# Patient Record
Sex: Female | Born: 1960 | Race: White | Hispanic: No | Marital: Married | Smoking: Never smoker
Health system: Southern US, Community
[De-identification: ages and names within clinical notes are randomized; demographics above are authoritative.]

---

## 2018-05-05 ENCOUNTER — Emergency Department (INDEPENDENT_AMBULATORY_CARE_PROVIDER_SITE_OTHER): Payer: Medicare Other

## 2018-05-05 ENCOUNTER — Encounter: Payer: Self-pay | Admitting: Emergency Medicine

## 2018-05-05 ENCOUNTER — Emergency Department (INDEPENDENT_AMBULATORY_CARE_PROVIDER_SITE_OTHER)
Admission: EM | Admit: 2018-05-05 | Discharge: 2018-05-05 | Disposition: A | Discharge status: Home / Self Care | Payer: Medicare Other | Source: Home / Self Care | Attending: Family Medicine | Admitting: Family Medicine

## 2018-05-05 DIAGNOSIS — R05 Cough: Secondary | ICD-10-CM

## 2018-05-05 DIAGNOSIS — J069 Acute upper respiratory infection, unspecified: Secondary | ICD-10-CM

## 2018-05-05 DIAGNOSIS — R0602 Shortness of breath: Secondary | ICD-10-CM | POA: Diagnosis not present

## 2018-05-05 MED ORDER — ALBUTEROL SULFATE HFA 108 (90 BASE) MCG/ACT IN AERS: 1.0000 | INHALATION_SPRAY | Freq: Four times a day (QID) | RESPIRATORY_TRACT | 0 refills | Status: AC | PRN

## 2018-05-05 NOTE — ED Triage Notes (Signed)
Patient c/o SOB x 2 days, worse w/movement, non-productive cough, runny nose, congestion

## 2018-05-05 NOTE — Discharge Instructions (Signed)
°  You may try the inhaler if your shortness of breath comes back.  If your symptoms are worsening- chest pain, weakness/numbness/dizziness, or your shortness of breath is not improving with the inhaler or rest, please call 911 or have someone take you to the hospital.  Please follow up with your family doctor later this week as needed.

## 2018-05-05 NOTE — ED Provider Notes (Signed)
[facility]    CSN: [id number] Arrival date & time: 05/05/18  1116     History   Chief Complaint Chief Complaint  Patient presents with  . Shortness of Breath    HPI Carrie Lopez is a 57 y.o. female.   HPI Carrie Lopez is a 57 y.o. female presenting to UC with c/o SOB for about 2 days, worse with movement. Associated non-productive cough, rhinorrhea and chest congestion.  She completed a course of antibiotics, she believes Azithromycin, about 1-2 weeks ago for URI symptoms.  Her symptoms seemed to improve until the last 2 days.  SOB is worse in the morning. She is feeling better in UC than she did when she initially woke this morning.  Denies CP. No SOB on exertion. She has been using essential oil to rub on her chest to help with cough, minimal improvement. She has used an inhaler in the past but does not currently have one.    History reviewed. No pertinent past medical history.  There are no active problems to display for this patient.   History reviewed. No pertinent surgical history.  OB History   None      Home Medications    Prior to Admission medications   Medication Sig Start Date End Date Taking? Authorizing Provider  DULoxetine (CYMBALTA) 60 MG capsule Take by mouth. 03/29/18  Yes [provider]  hydrochlorothiazide (HYDRODIURIL) 25 MG tablet Take by mouth. 03/28/18  Yes [provider]  lamoTRIgine (LAMICTAL) 200 MG tablet Take by mouth. 11/28/16  Yes [provider]  levothyroxine (SYNTHROID, LEVOTHROID) 100 MCG tablet TAKE 1 TABLET BY MOUTH ONCE DAILY AT 0600 05/03/18  Yes [provider]  pantoprazole (PROTONIX) 40 MG tablet Take by mouth. 03/08/17  Yes [provider]  traZODone (DESYREL) 150 MG tablet Take by mouth.   Yes [provider]  albuterol (PROVENTIL HFA;VENTOLIN HFA) 108 (90 Base) MCG/ACT inhaler Inhale 1-2 puffs into the lungs every 6 (six) hours as needed for wheezing or  shortness of breath. 05/05/18   Lurene Shadow, PA-C    Family History No family history on file.  Social History Social History   Tobacco Use  . Smoking status: Never Smoker  . Smokeless tobacco: Never Used  Substance Use Topics  . Alcohol use: Not on file  . Drug use: Not on file     Allergies   Hydrocodone   Review of Systems Review of Systems  Constitutional: Negative for chills and fever.  HENT: Positive for congestion, postnasal drip, rhinorrhea and sinus pressure. Negative for ear pain, sore throat, trouble swallowing and voice change.   Respiratory: Positive for shortness of breath. Negative for cough.   Cardiovascular: Negative for chest pain and palpitations.  Gastrointestinal: Negative for abdominal pain, diarrhea, nausea and vomiting.  Musculoskeletal: Negative for arthralgias, back pain and myalgias.  Skin: Negative for rash.     Physical Exam Triage Vital Signs ED Triage Vitals  Enc Vitals Group     BP 05/05/18 1137 (!) 149/80     Pulse Rate 05/05/18 1137 67     Resp --      Temp 05/05/18 1137 97.8 F (36.6 C)     Temp Source 05/05/18 1137 Oral     SpO2 05/05/18 1137 99 %     Weight 05/05/18 1139 192 lb 4 oz (87.2 kg)     Height 05/05/18 1139 5\' 4"  (1.626 m)     Head Circumference --  Peak Flow --      Pain Score 05/05/18 1139 0     Pain Loc --      Pain Edu? --      Excl. in GC? --    No data found.  Updated Vital Signs BP 138/84 (BP Location: Left Arm)   Pulse 67   Temp 97.8 F (36.6 C) (Oral)   Ht 5\' 4"  (1.626 m)   Wt 192 lb 4 oz (87.2 kg)   SpO2 99%   BMI 33.00 kg/m   Visual Acuity Right Eye Distance:   Left Eye Distance:   Bilateral Distance:    Right Eye Near:   Left Eye Near:    Bilateral Near:     Physical Exam  Constitutional: She is oriented to person, place, and time. She appears well-developed and well-nourished.  Non-toxic appearance. She does not appear ill. No distress.  HENT:  Head: Normocephalic and  atraumatic.  Right Ear: Tympanic membrane normal.  Left Ear: Tympanic membrane normal.  Nose: Mucosal edema present. Right sinus exhibits no maxillary sinus tenderness and no frontal sinus tenderness. Left sinus exhibits no maxillary sinus tenderness and no frontal sinus tenderness.  Mouth/Throat: Uvula is midline, oropharynx is clear and moist and mucous membranes are normal.  Eyes: EOM are normal.  Neck: Normal range of motion.  Cardiovascular: Normal rate and regular rhythm.  Pulmonary/Chest: Effort normal and breath sounds normal. She has no decreased breath sounds. She has no wheezes. She has no rhonchi. She has no rales.  Musculoskeletal: Normal range of motion.  Neurological: She is alert and oriented to person, place, and time.  Skin: Skin is warm and dry.  Psychiatric: She has a normal mood and affect. Her behavior is normal.  Nursing note and vitals reviewed.    UC Treatments / Results  Labs (all labs ordered are listed, but only abnormal results are displayed) Labs Reviewed - No data to display  EKG None  Radiology Dg Chest 2 View  Result Date: 05/05/2018 CLINICAL DATA:  Cough and short of breath for 2 weeks EXAM: CHEST - 2 VIEW COMPARISON:  01/16/2018 FINDINGS: Normal heart size. Lungs clear. No pneumothorax. No pleural effusion. Minimal scoliosis at the thoracolumbar junction IMPRESSION: No active cardiopulmonary disease. Electronically Signed   By: Jolaine Click M.D.   On: 05/05/2018 12:01    Procedures Procedures (including critical care time)  Medications Ordered in UC Medications - No data to display  Initial Impression / Assessment and Plan / UC Course  I have reviewed the triage vital signs and the nursing notes.  Pertinent labs & imaging results that were available during my care of the patient were reviewed by me and considered in my medical decision making (see chart for details).     Discussed vitals, WNL except slightly elevated BP, hx of same per  pt.  O2 Sat 99% Normal CXR Reassured pt no evidence of pneumonia. May try albuterol Doubt ACS or PE SOB could be residual from recent URI and/or allergies due to sudden drop in temperature the last 2 days.   Final Clinical Impressions(s) / UC Diagnoses   Final diagnoses:  SOB (shortness of breath)  URI with cough and congestion     Discharge Instructions      You may try the inhaler if your shortness of breath comes back.  If your symptoms are worsening- chest pain, weakness/numbness/dizziness, or your shortness of breath is not improving with the inhaler or rest, please call 911 or have  someone take you to the hospital.  Please follow up with your family doctor later this week as needed.     ED Prescriptions    Medication Sig Dispense Auth. Provider   albuterol (PROVENTIL HFA;VENTOLIN HFA) 108 (90 Base) MCG/ACT inhaler Inhale 1-2 puffs into the lungs every 6 (six) hours as needed for wheezing or shortness of breath. 1 Inhaler Lurene ShadowPhelps, Emmalee Solivan O, PA-C     Controlled Substance Prescriptions [place] Controlled Substance Registry consulted? Not Applicable   Rolla Platehelps, Bronte Kropf O, PA-C 05/06/18 1250

## 2019-08-27 IMAGING — DX DG CHEST 2V
2 series · 2 of 2 positions shown · non-contrast
Comparison: 01/16/2018

CLINICAL DATA: Cough and short of breath for 2 weeks

EXAM:
CHEST - 2 VIEW

[chest pa]
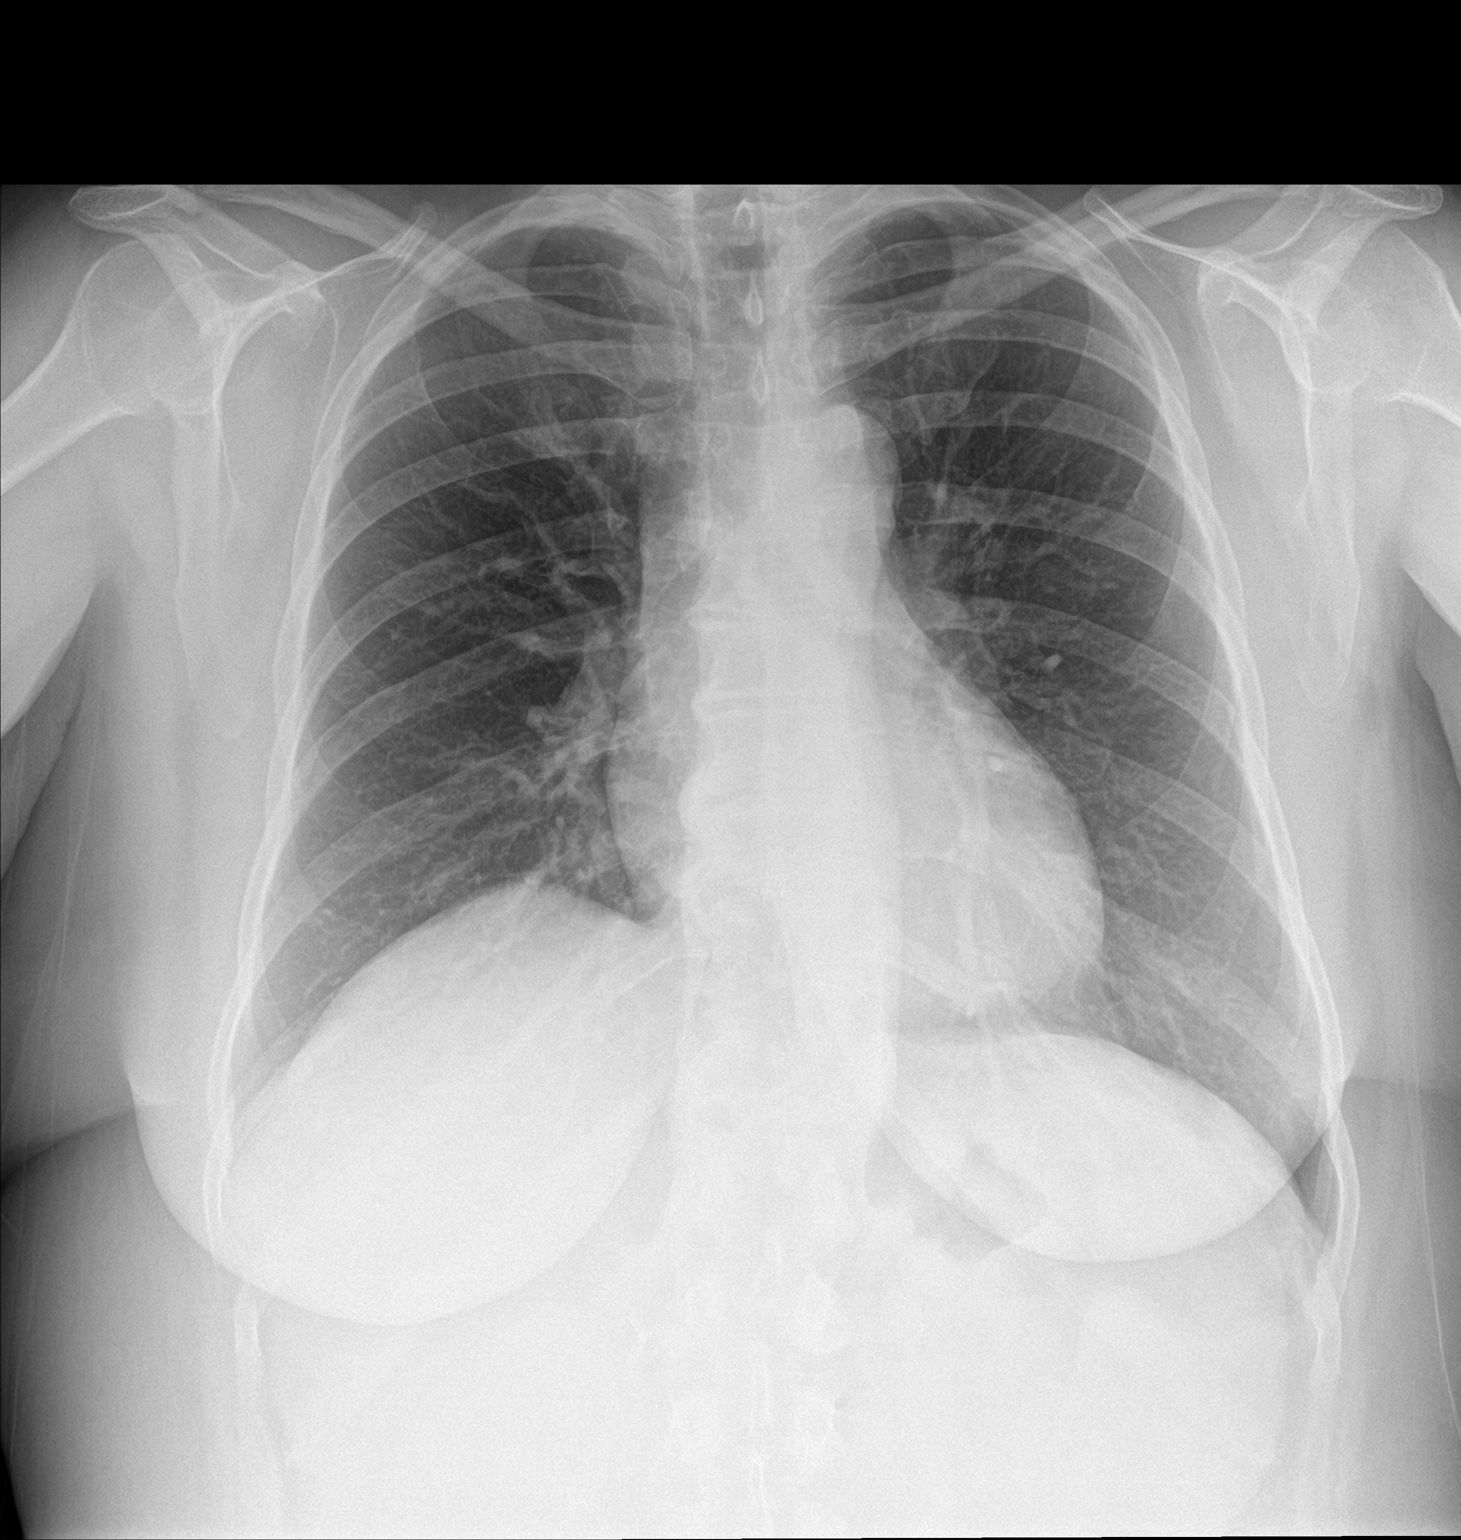

[chest lat]
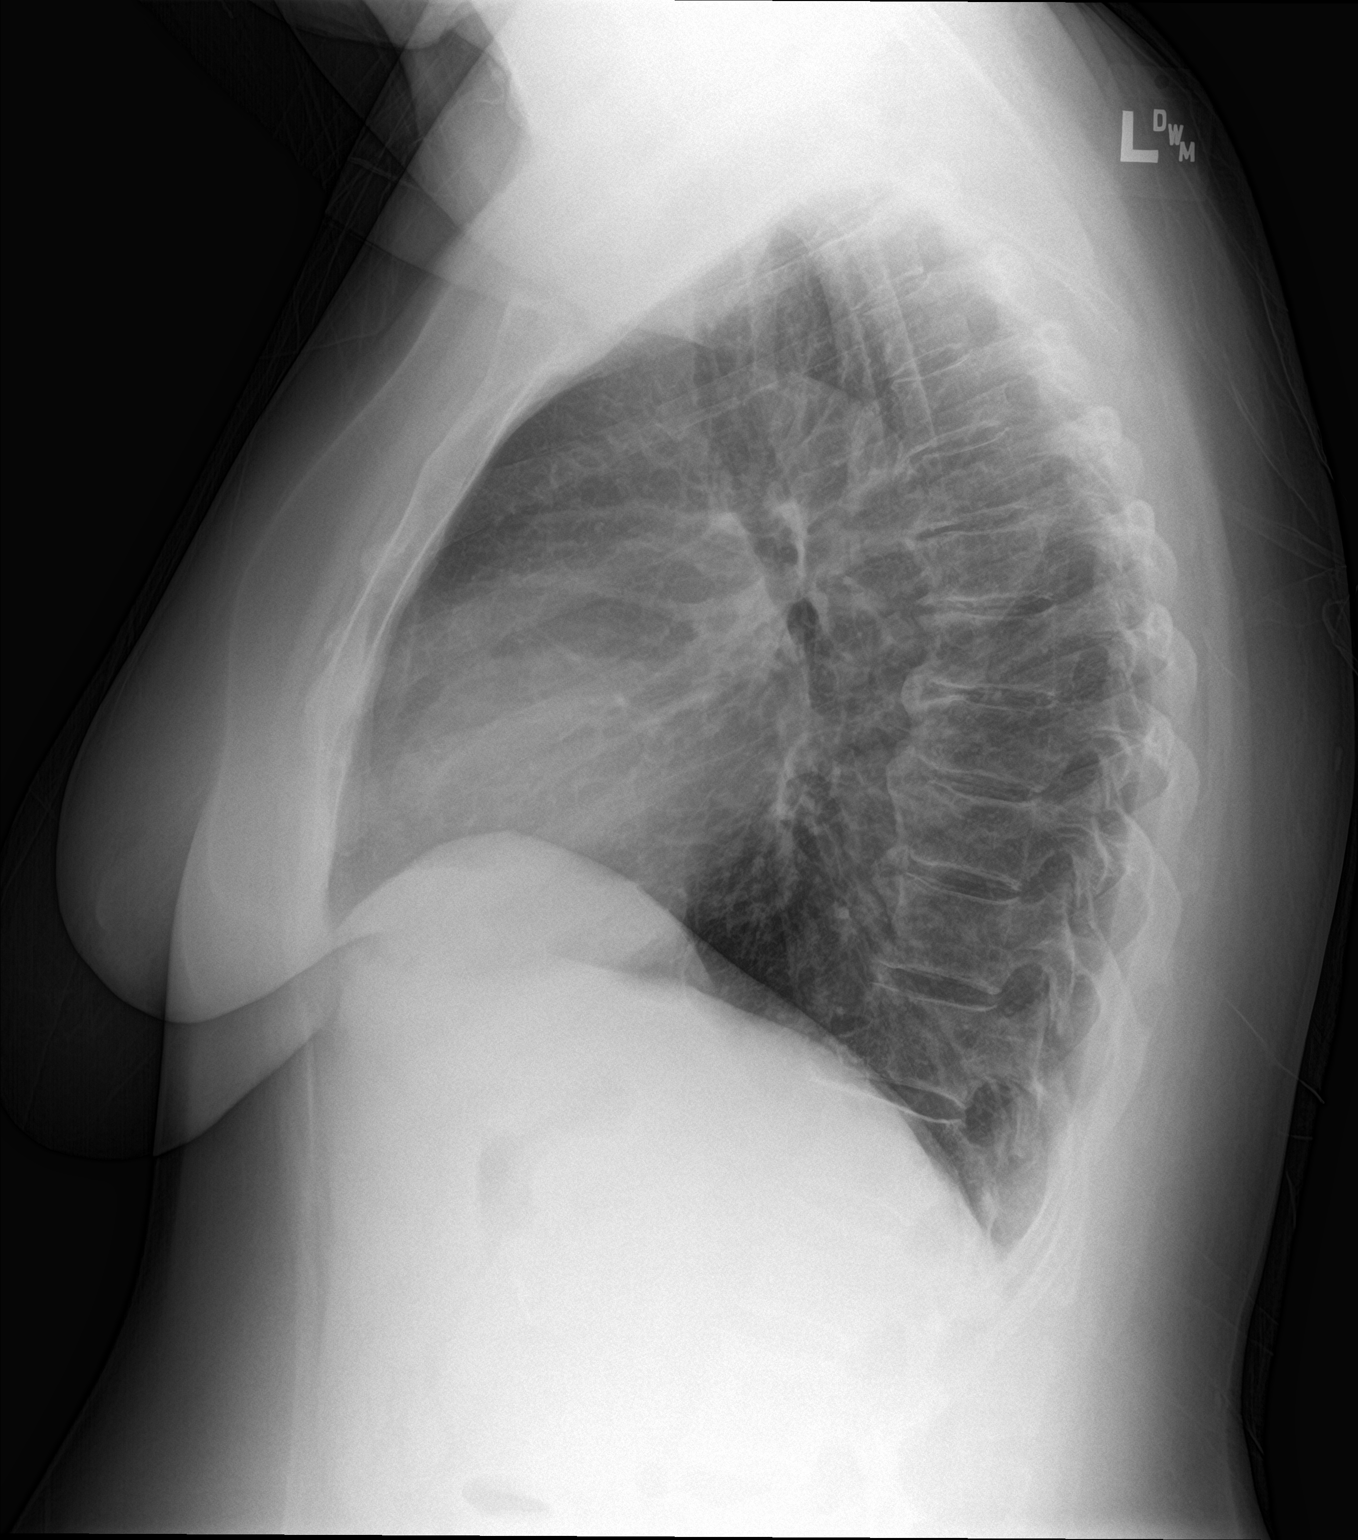

[2 of 2 positions shown; findings below may reference images not displayed]

FINDINGS: Normal heart size. Lungs clear. No pneumothorax. No pleural
effusion. Minimal scoliosis at the thoracolumbar junction
IMPRESSION: No active cardiopulmonary disease.
